# Patient Record
Sex: Female | Born: 1975 | Hispanic: No | Marital: Single
Health system: Southern US, Community
[De-identification: ages and names within clinical notes are randomized; demographics above are authoritative.]

---

## 2015-02-16 ENCOUNTER — Other Ambulatory Visit: Payer: Self-pay | Admitting: Obstetrics and Gynecology

## 2015-02-16 DIAGNOSIS — N930 Postcoital and contact bleeding: Secondary | ICD-10-CM

## 2015-02-17 ENCOUNTER — Ambulatory Visit
Admission: RE | Admit: 2015-02-17 | Discharge: 2015-02-17 | Disposition: A | Discharge status: Home / Self Care | Payer: BLUE CROSS/BLUE SHIELD | Source: Ambulatory Visit | Attending: Obstetrics and Gynecology | Admitting: Obstetrics and Gynecology

## 2015-02-17 DIAGNOSIS — N930 Postcoital and contact bleeding: Secondary | ICD-10-CM

## 2019-08-15 ENCOUNTER — Emergency Department (HOSPITAL_COMMUNITY)
Admission: EM | Admit: 2019-08-15 | Discharge: 2019-08-16 | Disposition: A | Discharge status: Home / Self Care | Payer: BC Managed Care – PPO | Attending: Emergency Medicine | Admitting: Emergency Medicine

## 2019-08-15 ENCOUNTER — Other Ambulatory Visit: Payer: Self-pay

## 2019-08-15 DIAGNOSIS — Z79899 Other long term (current) drug therapy: Secondary | ICD-10-CM | POA: Diagnosis not present

## 2019-08-15 DIAGNOSIS — F10921 Alcohol use, unspecified with intoxication delirium: Secondary | ICD-10-CM | POA: Diagnosis not present

## 2019-08-15 DIAGNOSIS — Y907 Blood alcohol level of 200-239 mg/100 ml: Secondary | ICD-10-CM | POA: Diagnosis not present

## 2019-08-15 DIAGNOSIS — R4182 Altered mental status, unspecified: Secondary | ICD-10-CM | POA: Diagnosis present

## 2019-08-16 ENCOUNTER — Emergency Department (HOSPITAL_COMMUNITY): Payer: BC Managed Care – PPO

## 2019-08-16 LAB — CBC WITH DIFFERENTIAL/PLATELET
Abs Immature Granulocytes: 0.08 10*3/uL — ABNORMAL HIGH (ref 0.00–0.07)
Basophils Absolute: 0.1 10*3/uL (ref 0.0–0.1)
Basophils Relative: 1 %
Eosinophils Absolute: 0.2 10*3/uL (ref 0.0–0.5)
Eosinophils Relative: 2 %
HCT: 39.8 % (ref 36.0–46.0)
Hemoglobin: 13.2 g/dL (ref 12.0–15.0)
Immature Granulocytes: 1 %
Lymphocytes Relative: 24 %
Lymphs Abs: 2.9 10*3/uL (ref 0.7–4.0)
MCH: 31.3 pg (ref 26.0–34.0)
MCHC: 33.2 g/dL (ref 30.0–36.0)
MCV: 94.3 fL (ref 80.0–100.0)
Monocytes Absolute: 0.6 10*3/uL (ref 0.1–1.0)
Monocytes Relative: 5 %
Neutro Abs: 8.4 10*3/uL — ABNORMAL HIGH (ref 1.7–7.7)
Neutrophils Relative %: 67 %
Platelets: 433 10*3/uL — ABNORMAL HIGH (ref 150–400)
RBC: 4.22 MIL/uL (ref 3.87–5.11)
RDW: 12.2 % (ref 11.5–15.5)
WBC: 12.2 10*3/uL — ABNORMAL HIGH (ref 4.0–10.5)
nRBC: 0 % (ref 0.0–0.2)

## 2019-08-16 LAB — COMPREHENSIVE METABOLIC PANEL
ALT: 18 U/L (ref 0–44)
AST: 17 U/L (ref 15–41)
Albumin: 4.1 g/dL (ref 3.5–5.0)
Alkaline Phosphatase: 60 U/L (ref 38–126)
Anion gap: 12 (ref 5–15)
BUN: 8 mg/dL (ref 6–20)
CO2: 21 mmol/L — ABNORMAL LOW (ref 22–32)
Calcium: 8.6 mg/dL — ABNORMAL LOW (ref 8.9–10.3)
Chloride: 109 mmol/L (ref 98–111)
Creatinine, Ser: 0.47 mg/dL (ref 0.44–1.00)
GFR calc Af Amer: >60 mL/min (ref >60)
GFR calc non Af Amer: >60 mL/min (ref >60)
Glucose, Bld: 139 mg/dL — ABNORMAL HIGH (ref 70–99)
Potassium: 3.5 mmol/L (ref 3.5–5.1)
Sodium: 142 mmol/L (ref 135–145)
Total Bilirubin: 0.4 mg/dL (ref 0.3–1.2)
Total Protein: 7.6 g/dL (ref 6.5–8.1)

## 2019-08-16 LAB — RAPID URINE DRUG SCREEN, HOSP PERFORMED
Amphetamines: NOT DETECTED
Barbiturates: NOT DETECTED
Benzodiazepines: NOT DETECTED
Cocaine: NOT DETECTED
Opiates: NOT DETECTED
Tetrahydrocannabinol: NOT DETECTED

## 2019-08-16 LAB — URINALYSIS, ROUTINE W REFLEX MICROSCOPIC
Bilirubin Urine: NEGATIVE
Glucose, UA: NEGATIVE mg/dL
Hgb urine dipstick: NEGATIVE
Ketones, ur: 5 mg/dL — AB
Leukocytes,Ua: NEGATIVE
Nitrite: NEGATIVE
Protein, ur: NEGATIVE mg/dL
Specific Gravity, Urine: 1.012 (ref 1.005–1.030)
pH: 5 (ref 5.0–8.0)

## 2019-08-16 LAB — ETHANOL: Alcohol, Ethyl (B): 219 mg/dL — ABNORMAL HIGH (ref <10)

## 2019-08-16 LAB — I-STAT BETA HCG BLOOD, ED (MC, WL, AP ONLY): I-stat hCG, quantitative: <5 m[IU]/mL (ref <5)

## 2019-08-16 MED ORDER — HALOPERIDOL LACTATE 5 MG/ML IJ SOLN
5.0000 mg | Freq: Once | INTRAMUSCULAR | Status: AC
  Administered 2019-08-16: 5 mg via INTRAVENOUS
  Filled 2019-08-16: qty 1

## 2019-08-16 NOTE — ED Provider Notes (Signed)
[facility] DEPT Provider Note   CSN: [id number] Arrival date & time: 08/15/19  2346     History   Chief Complaint Chief Complaint  Patient presents with   Altered Mental Status    HPI Danielle Donaldson Danielle Donaldson is a 43 y.o. female.     Patient brought to the emergency department from a party by EMS.  Patient had gotten to the party around 6 PM and had been drinking alcohol.  At some point party coders found her lying on the floor in the back bedroom, agitated and yelling.  EMS report that she has been extremely agitated and combative during transport, not able to answer any questions.  She has been essentially yelling and thrashing throughout transport. Level V Caveat due to mental status change.     No past medical history on file.  There are no active problems to display for this patient.      OB History   No obstetric history on file.      Home Medications    Prior to Admission medications   Medication Sig Start Date End Date Taking? Authorizing Provider  Norethindrone Acetate-Ethinyl Estradiol (JUNEL 1.5/30) 1.5-30 MG-MCG tablet Take 1 tablet by mouth daily.   Yes [provider]    Family History No family history on file.  Social History Social History   Tobacco Use   Smoking status: Not on file  Substance Use Topics   Alcohol use: Not on file   Drug use: Not on file     Allergies   Patient has no allergy information on record.   Review of Systems Review of Systems  Unable to perform ROS: Mental status change     Physical Exam Updated Vital Signs BP 93/62    Pulse 78    Temp 98.1 F (36.7 C) (Axillary)    Resp 19    SpO2 96%   Physical Exam Vitals signs and nursing note reviewed.  Constitutional:      General: She is in acute distress.     Appearance: She is well-developed.  HENT:     Head: Normocephalic and atraumatic.     Right Ear: Hearing normal.     Left Ear: Hearing normal.     Nose: Nose normal.  Eyes:     Conjunctiva/sclera: Conjunctivae normal.     Pupils: Pupils are equal, round, and reactive to light.  Neck:     Musculoskeletal: Normal range of motion and neck supple.  Cardiovascular:     Rate and Rhythm: Regular rhythm.     Heart sounds: S1 normal and S2 normal. No murmur. No friction rub. No gallop.   Pulmonary:     Effort: Pulmonary effort is normal. No respiratory distress.     Breath sounds: Normal breath sounds.  Chest:     Chest wall: No tenderness.  Abdominal:     General: Bowel sounds are normal.     Palpations: Abdomen is soft.     Tenderness: There is no abdominal tenderness. There is no guarding or rebound. Negative signs include Murphy's sign and McBurney's sign.     Hernia: No hernia is present.  Musculoskeletal: Normal range of motion.  Skin:    General: Skin is warm and dry.     Findings: No rash.  Neurological:     Mental Status: She is alert.     GCS: GCS eye subscore is 4. GCS verbal subscore is 4. GCS motor subscore is 5.  Cranial Nerves: No cranial nerve deficit.     Sensory: No sensory deficit.     Coordination: Coordination normal.  Psychiatric:        Speech: Speech normal.        Behavior: Behavior normal.        Thought Content: Thought content normal.      ED Treatments / Results  Labs (all labs ordered are listed, but only abnormal results are displayed) Labs Reviewed  CBC WITH DIFFERENTIAL/PLATELET - Abnormal; Notable for the following components:      Result Value   WBC 12.2 (*)    Platelets 433 (*)    Neutro Abs 8.4 (*)    Abs Immature Granulocytes 0.08 (*)    All other components within normal limits  COMPREHENSIVE METABOLIC PANEL - Abnormal; Notable for the following components:   CO2 21 (*)    Glucose, Bld 139 (*)    Calcium 8.6 (*)    All other components within normal limits  URINALYSIS, ROUTINE W REFLEX MICROSCOPIC - Abnormal; Notable for the following components:   APPearance HAZY (*)     Ketones, ur 5 (*)    All other components within normal limits  ETHANOL - Abnormal; Notable for the following components:   Alcohol, Ethyl (B) 219 (*)    All other components within normal limits  RAPID URINE DRUG SCREEN, HOSP PERFORMED  I-STAT BETA HCG BLOOD, ED ([facility], [facility], [facility] ONLY)    EKG EKG Interpretation  Date/Time:  Sunday August 16 2019 01:58:12 EST Ventricular Rate:  89 PR Interval:    QRS Duration: 74 QT Interval:  390 QTC Calculation: 475 R Axis:   91 Text Interpretation: Sinus rhythm Early repolarization No previous tracing Confirmed by Gilda CreasePollina, Andra Heslin J [phone]([id number]) on 08/16/2019 2:14:23 AM   Radiology Ct Head Wo Contrast  Result Date: 08/16/2019 CLINICAL DATA:  Head trauma. Alcohol intoxication. EXAM: CT HEAD WITHOUT CONTRAST CT CERVICAL SPINE WITHOUT CONTRAST TECHNIQUE: Multidetector CT imaging of the head and cervical spine was performed following the standard protocol without intravenous contrast. Multiplanar CT image reconstructions of the cervical spine were also generated. COMPARISON:  None. FINDINGS: CT HEAD FINDINGS Brain: For the size and configuration of the ventricles and extra-axial CSF spaces are normal. The brain parenchyma is normal, without evidence of acute or chronic infarction. Vascular: No abnormal hyperdensity of the major intracranial arteries or dural venous sinuses. No intracranial atherosclerosis. Skull: The visualized skull base, calvarium and extracranial soft tissues are normal. Sinuses/Orbits: No fluid levels or advanced mucosal thickening of the visualized paranasal sinuses. No mastoid or middle ear effusion. The orbits are normal. CT CERVICAL SPINE FINDINGS Alignment: No static subluxation. Facets are aligned. Occipital condyles are normally positioned. Skull base and vertebrae: No acute fracture. Soft tissues and spinal canal: No prevertebral fluid or swelling. No visible canal hematoma. Disc levels: No advanced spinal canal or neural foraminal  stenosis. Upper chest: No pneumothorax, pulmonary nodule or pleural effusion. Other: Normal visualized paraspinal cervical soft tissues. IMPRESSION: 1. No acute intracranial abnormality. 2. No acute fracture or static subluxation of the cervical spine. Electronically Signed   By: Deatra RobinsonKevin  Herman M.D.   On: 08/16/2019 01:43   Ct Cervical Spine Wo Contrast  Result Date: 08/16/2019 CLINICAL DATA:  Head trauma. Alcohol intoxication. EXAM: CT HEAD WITHOUT CONTRAST CT CERVICAL SPINE WITHOUT CONTRAST TECHNIQUE: Multidetector CT imaging of the head and cervical spine was performed following the standard protocol without intravenous contrast. Multiplanar CT image reconstructions of the cervical spine were also  generated. COMPARISON:  None. FINDINGS: CT HEAD FINDINGS Brain: For the size and configuration of the ventricles and extra-axial CSF spaces are normal. The brain parenchyma is normal, without evidence of acute or chronic infarction. Vascular: No abnormal hyperdensity of the major intracranial arteries or dural venous sinuses. No intracranial atherosclerosis. Skull: The visualized skull base, calvarium and extracranial soft tissues are normal. Sinuses/Orbits: No fluid levels or advanced mucosal thickening of the visualized paranasal sinuses. No mastoid or middle ear effusion. The orbits are normal. CT CERVICAL SPINE FINDINGS Alignment: No static subluxation. Facets are aligned. Occipital condyles are normally positioned. Skull base and vertebrae: No acute fracture. Soft tissues and spinal canal: No prevertebral fluid or swelling. No visible canal hematoma. Disc levels: No advanced spinal canal or neural foraminal stenosis. Upper chest: No pneumothorax, pulmonary nodule or pleural effusion. Other: Normal visualized paraspinal cervical soft tissues. IMPRESSION: 1. No acute intracranial abnormality. 2. No acute fracture or static subluxation of the cervical spine. Electronically Signed   By: Deatra Robinson M.D.   On:  08/16/2019 01:43    Procedures Procedures (including critical care time)  Medications Ordered in ED Medications  haloperidol lactate (HALDOL) injection 5 mg (5 mg Intravenous Given 08/16/19 0028)     Initial Impression / Assessment and Plan / ED Course  I have reviewed the triage vital signs and the nursing notes.  Pertinent labs & imaging results that were available during my care of the patient were reviewed by me and considered in my medical decision making (see chart for details).        Patient brought to the emergency department after she was at a party tonight and had significant alcohol intake.  Patient was found by party goers laying on the ground is agitated and confused.  At arrival to the ER she is obviously intoxicated and agitated.  She required some restraints at arrival to keep her from harming herself.  Patient administered Haldol to facilitate work-up.  As she was found on the ground, it was unclear if she had a fall.  No outward signs of any significant trauma.  CT head and cervical spine performed, both of these were normal.  Remainder of work-up was unremarkable other than a significant alcohol level.  Patient will be monitored through the night and when more sober reevaluated and discharged if back to baseline.  Final Clinical Impressions(s) / ED Diagnoses   Final diagnoses:  Alcohol intoxication with delirium [facility])    ED Discharge Orders    None       Gilda Crease, MD 08/16/19 [phone]

## 2019-08-16 NOTE — ED Notes (Signed)
PT CELL PHONE IS ON BEDSIDE TABLE

## 2019-08-16 NOTE — ED Triage Notes (Signed)
Arrived by [facility] from party. As verbally communicated by EMS: Patient was at party at friend's house. Patient had consumed  "a lot of alcohol since arriving at party at [facility] report that patient was found in back bedroom flailing and beating her head on floor. Patient arrived to this facility in 4 point restraints, flailing on stretcher with incomprehensible speech

## 2019-11-19 ENCOUNTER — Ambulatory Visit: Payer: BC Managed Care – PPO

## 2019-11-23 ENCOUNTER — Ambulatory Visit: Payer: BC Managed Care – PPO | Attending: Family

## 2019-11-23 DIAGNOSIS — Z23 Encounter for immunization: Secondary | ICD-10-CM | POA: Insufficient documentation

## 2019-11-23 NOTE — Progress Notes (Signed)
   Covid-19 Vaccination Clinic  Name:  Danielle Donaldson    MRN: [id number] DOB: August 29, 1976  11/23/2019  [name] was observed post Covid-19 immunization for 15 minutes without incidence. She was provided with Vaccine Information Sheet and instruction to access the V-Safe system.   [name] was instructed to call 911 with any severe reactions post vaccine: Marland Kitchen Difficulty breathing  . Swelling of your face and throat  . A fast heartbeat  . A bad rash all over your body  . Dizziness and weakness    Immunizations Administered    Name Date Dose VIS Date Route   Moderna COVID-19 Vaccine 11/23/2019  1:00 PM 0.5 mL 09/01/2019 Intramuscular   Manufacturer: Moderna   Lot: [id number]   NDC: 84033-533-17

## 2019-12-29 ENCOUNTER — Ambulatory Visit: Payer: BC Managed Care – PPO | Attending: Family

## 2019-12-29 DIAGNOSIS — Z23 Encounter for immunization: Secondary | ICD-10-CM

## 2019-12-29 NOTE — Progress Notes (Signed)
   Covid-19 Vaccination Clinic  Name:  Danielle Donaldson    MRN: [id number] DOB: 07-02-1976  12/29/2019  Ms. Danielle Donaldson was observed post Covid-19 immunization for 15 minutes without incident. She was provided with Vaccine Information Sheet and instruction to access the V-Safe system.   Ms. Danielle Donaldson was instructed to call 911 with any severe reactions post vaccine: Marland Kitchen Difficulty breathing  . Swelling of face and throat  . A fast heartbeat  . A bad rash all over body  . Dizziness and weakness   Immunizations Administered    Name Date Dose VIS Date Route   Moderna COVID-19 Vaccine 12/29/2019 11:02 AM 0.5 mL 09/01/2019 Intramuscular   Manufacturer: Moderna   Lot: [id number]   NDC: 21224-825-00

## 2020-06-19 IMAGING — CT CT HEAD W/O CM
1 series · 15 of 30 positions shown, 19 images · non-contrast
Comparison: None.

CLINICAL DATA: Head trauma. Alcohol intoxication.

EXAM:
CT HEAD WITHOUT CONTRAST
CT CERVICAL SPINE WITHOUT CONTRAST
TECHNIQUE: Multidetector CT imaging of the head and cervical spine was
performed following the standard protocol without intravenous
contrast. Multiplanar CT image reconstructions of the cervical spine
were also generated.

[Series 3: head wo · axial · 0.40mm/px · z∈[-161,-6]mm · 15 of 35 slices shown, 19 images]
[im 2/35  brain]
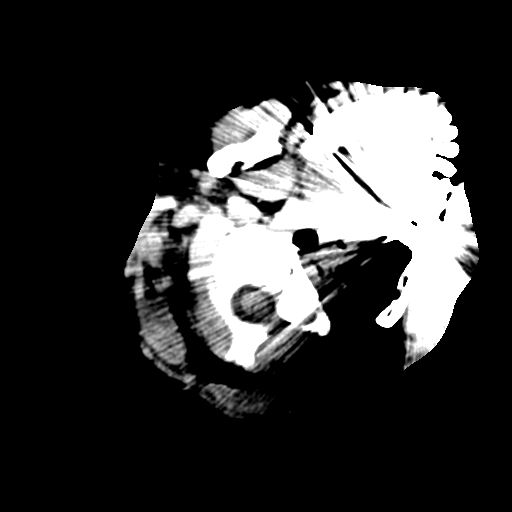
[im 2/35  bone]
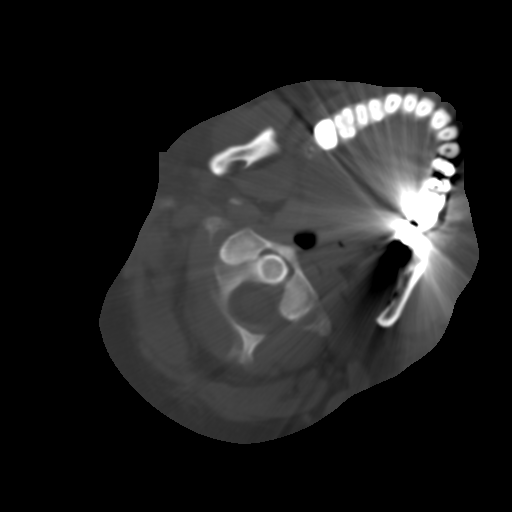
[im 4/35  brain]
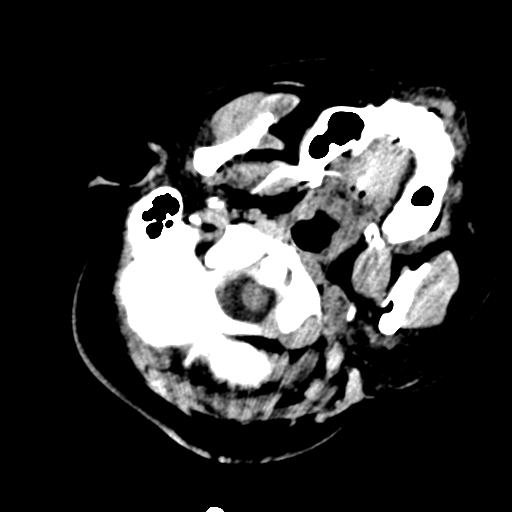
[im 6/35  brain]
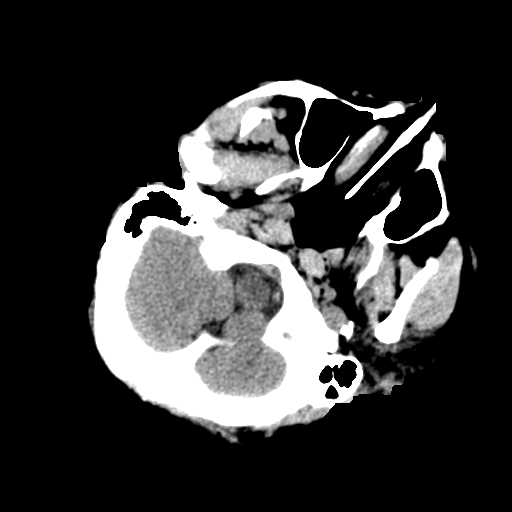
[im 9/35  brain]
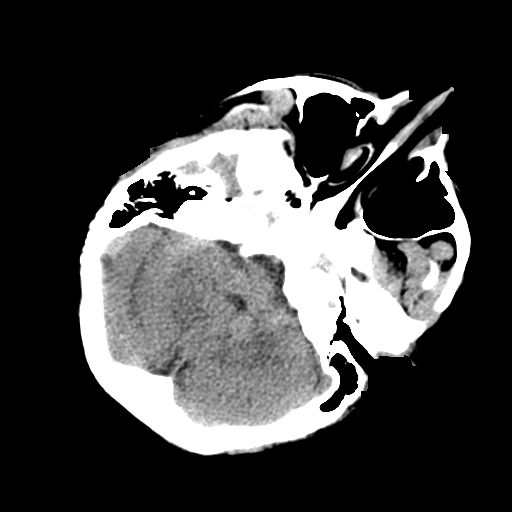
[im 11/35  brain]
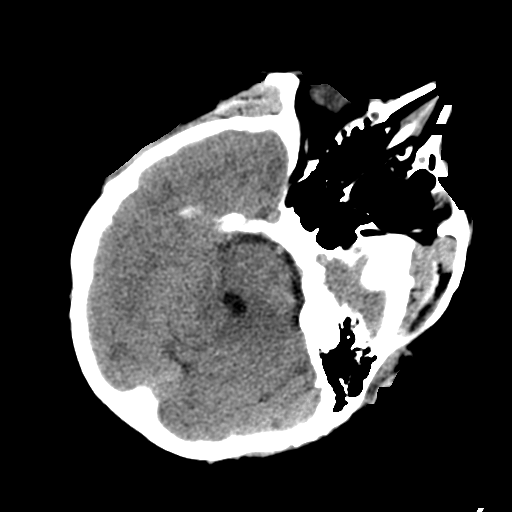
[im 11/35  bone]
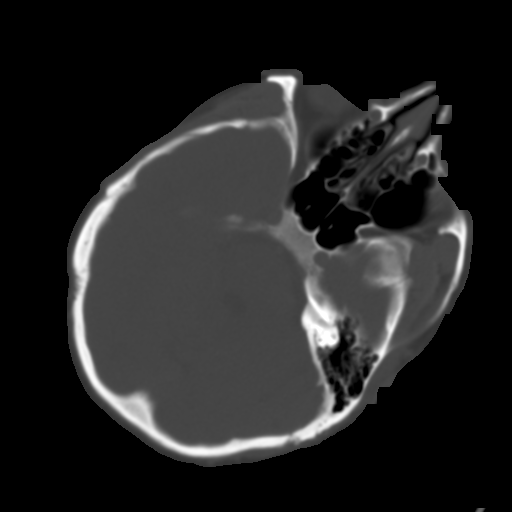
[im 13/35  brain]
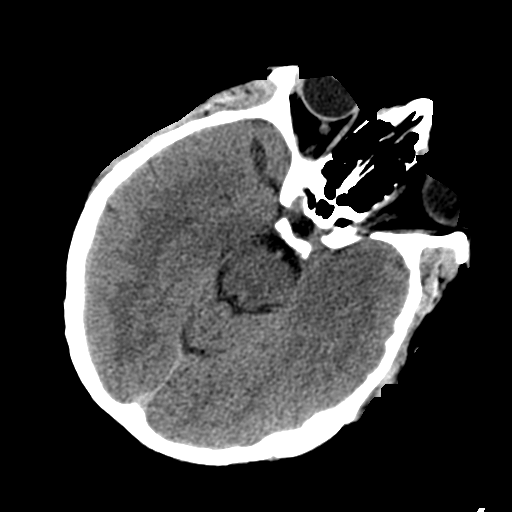
[im 16/35  brain]
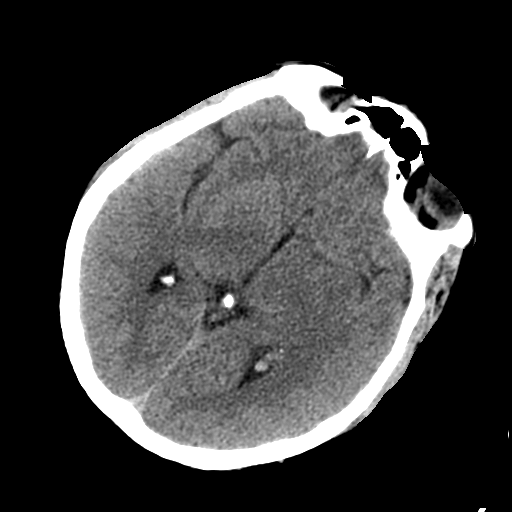
[im 18/35  brain]
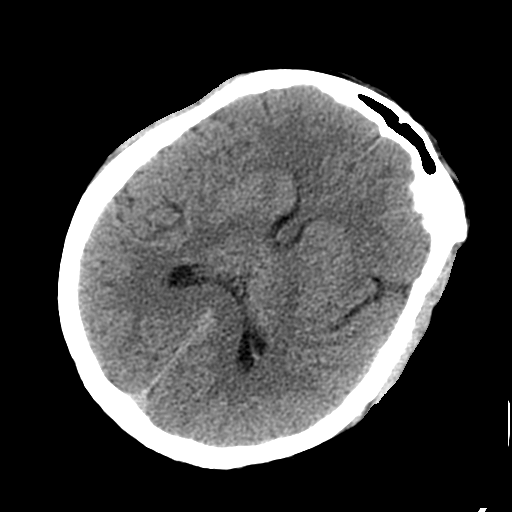
[im 19/35  brain]
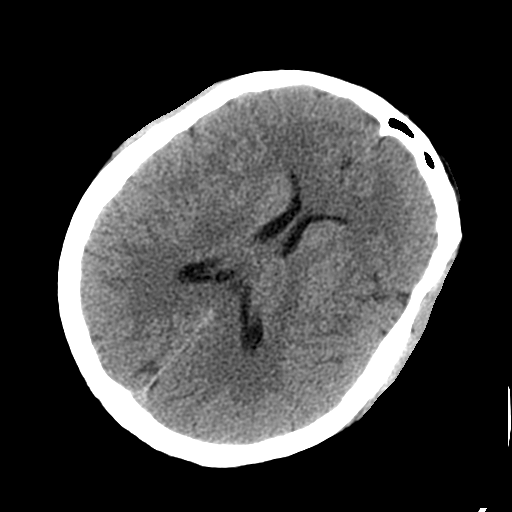
[im 19/35  bone]
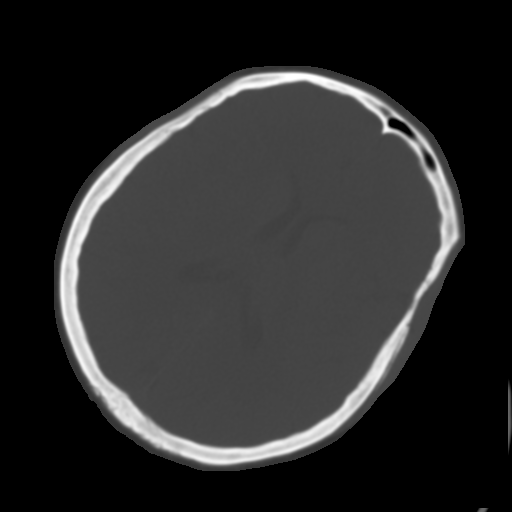
[im 22/35  brain]
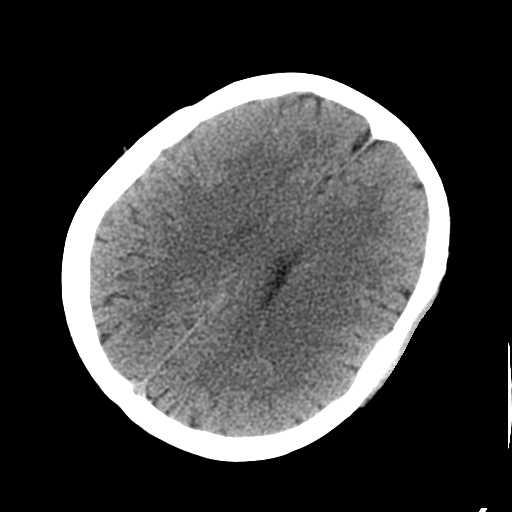
[im 24/35  brain]
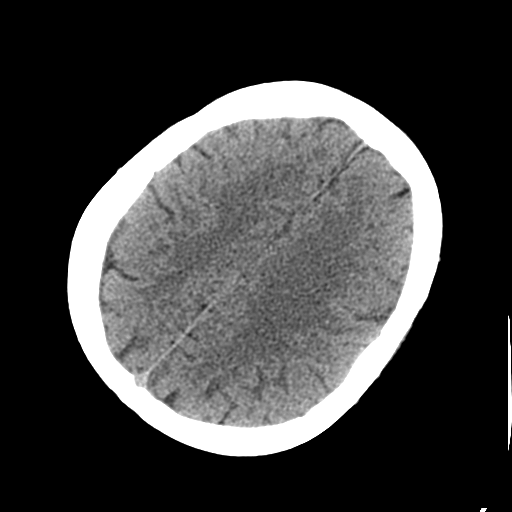
[im 26/35  brain]
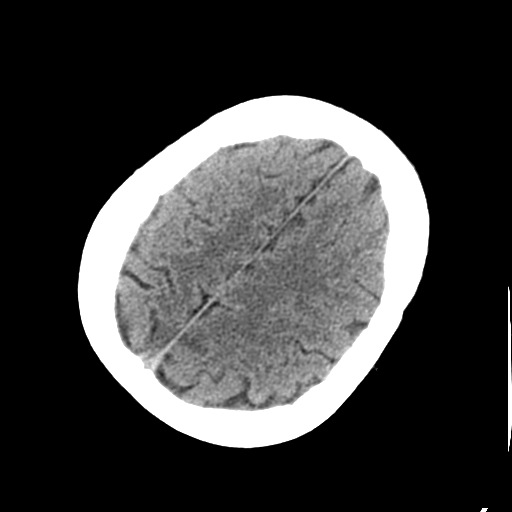
[im 29/35  brain]
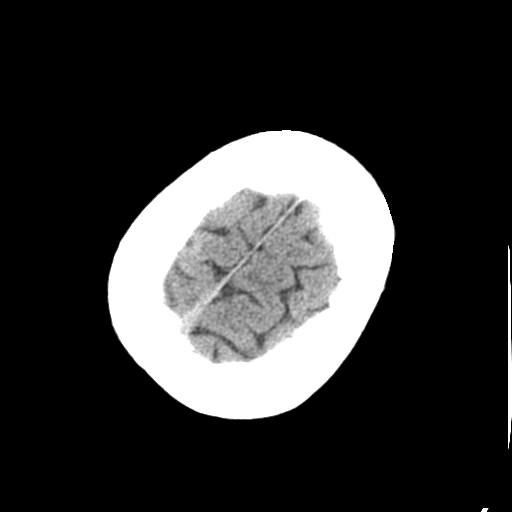
[im 29/35  bone]
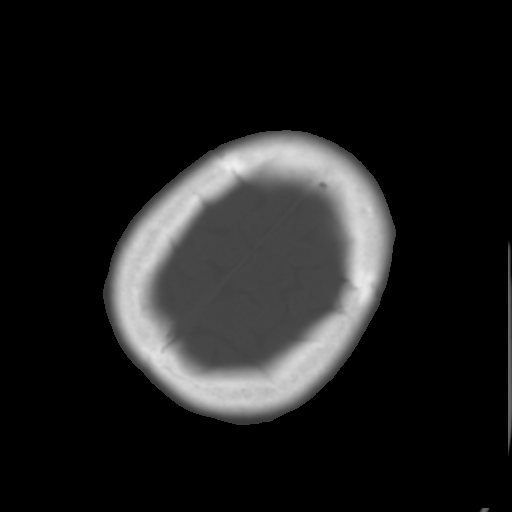
[im 31/35  brain]
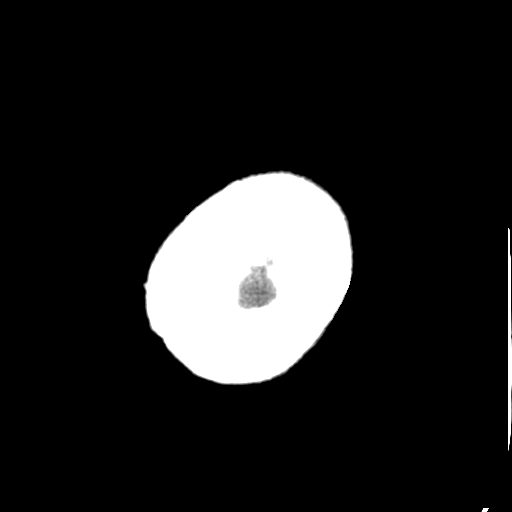
[im 33/35  brain]
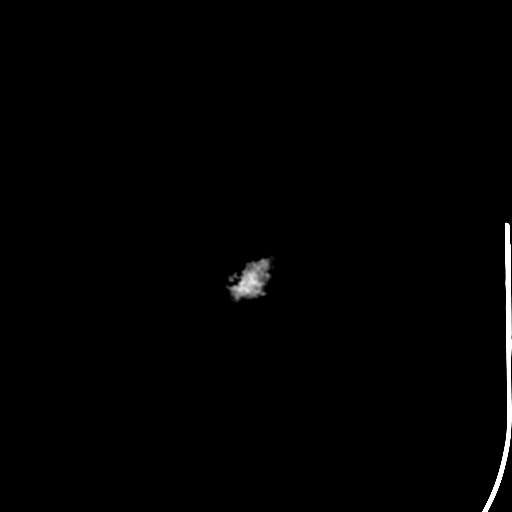

[15 of 30 positions shown; findings below may reference images not displayed]

FINDINGS: CT HEAD FINDINGS

Brain: For the size and configuration of the ventricles and
extra-axial CSF spaces are normal. The brain parenchyma is normal,
without evidence of acute or chronic infarction.

Vascular: No abnormal hyperdensity of the major intracranial
arteries or dural venous sinuses. No intracranial atherosclerosis.

Skull: The visualized skull base, calvarium and extracranial soft
tissues are normal.

Sinuses/Orbits: No fluid levels or advanced mucosal thickening of
the visualized paranasal sinuses. No mastoid or middle ear effusion.
The orbits are normal.

CT CERVICAL SPINE FINDINGS

Alignment: No static subluxation. Facets are aligned. Occipital
condyles are normally positioned.

Skull base and vertebrae: No acute fracture.

Soft tissues and spinal canal: No prevertebral fluid or swelling. No
visible canal hematoma.

Disc levels: No advanced spinal canal or neural foraminal stenosis.

Upper chest: No pneumothorax, pulmonary nodule or pleural effusion.

Other: Normal visualized paraspinal cervical soft tissues.
IMPRESSION: 1. No acute intracranial abnormality.
2. No acute fracture or static subluxation of the cervical spine.

## 2020-06-30 ENCOUNTER — Ambulatory Visit: Payer: BC Managed Care – PPO | Attending: Family

## 2020-06-30 DIAGNOSIS — Z23 Encounter for immunization: Secondary | ICD-10-CM
